# Patient Record
Sex: Male | Born: 1965 | Race: Black or African American | Hispanic: No | Marital: Married | Smoking: Never smoker
Health system: Southern US, Community
[De-identification: ages and names within clinical notes are randomized; demographics above are authoritative.]

---

## 2000-04-12 ENCOUNTER — Emergency Department (HOSPITAL_COMMUNITY): Admission: EM | Admit: 2000-04-12 | Discharge: 2000-04-12 | Payer: Self-pay | Admitting: Emergency Medicine

## 2000-11-25 ENCOUNTER — Emergency Department (HOSPITAL_COMMUNITY): Admission: EM | Admit: 2000-11-25 | Discharge: 2000-11-26 | Payer: Self-pay | Admitting: Emergency Medicine

## 2004-07-24 ENCOUNTER — Encounter: Admission: RE | Admit: 2004-07-24 | Discharge: 2004-07-24 | Payer: Self-pay | Admitting: Family Medicine

## 2008-08-16 ENCOUNTER — Ambulatory Visit (HOSPITAL_COMMUNITY): Admission: RE | Admit: 2008-08-16 | Discharge: 2008-08-16 | Payer: Self-pay | Admitting: Family Medicine

## 2011-11-04 ENCOUNTER — Ambulatory Visit: Payer: BC Managed Care – PPO

## 2011-11-04 ENCOUNTER — Ambulatory Visit: Payer: BC Managed Care – PPO | Admitting: Family Medicine

## 2011-11-04 VITALS — BP 108/73 | HR 82 | Temp 98.5°F | Resp 16 | Ht 71.5 in | Wt 225.4 lb

## 2011-11-04 DIAGNOSIS — M79644 Pain in right finger(s): Secondary | ICD-10-CM

## 2011-11-04 DIAGNOSIS — L03019 Cellulitis of unspecified finger: Secondary | ICD-10-CM

## 2011-11-04 DIAGNOSIS — M79609 Pain in unspecified limb: Secondary | ICD-10-CM

## 2011-11-04 DIAGNOSIS — IMO0001 Reserved for inherently not codable concepts without codable children: Secondary | ICD-10-CM

## 2011-11-04 MED ORDER — DOXYCYCLINE HYCLATE 100 MG PO TABS: 100.0000 mg | ORAL_TABLET | Freq: Two times a day (BID) | ORAL | Status: AC

## 2011-11-04 NOTE — Patient Instructions (Signed)
Paronychia  Paronychia is an inflammatory reaction involving the folds of the skin surrounding the fingernail. This is commonly caused by an infection in the skin around a nail. The most common cause of paronychia is frequent wetting of the hands (as seen with bartenders, food servers, nurses or others who wet their hands). This makes the skin around the fingernail susceptible to infection by bacteria (germs) or fungus. Other predisposing factors are:   Aggressive manicuring.   Nail biting.   Thumb sucking.  The most common cause is a staphylococcal (a type of germ) infection, or a fungal (Candida) infection. When caused by a germ, it usually comes on suddenly with redness, swelling, pus and is often painful. It may get under the nail and form an abscess (collection of pus), or form an abscess around the nail. If the nail itself is infected with a fungus, the treatment is usually prolonged and may require oral medicine for up to one year. Your caregiver will determine the length of time treatment is required. The paronychia caused by bacteria (germs) may largely be avoided by not pulling on hangnails or picking at cuticles. When the infection occurs at the tips of the finger it is called felon. When the cause of paronychia is from the herpes simplex virus (HSV) it is called herpetic whitlow.  TREATMENT   When an abscess is present treatment is often incision and drainage. This means that the abscess must be cut open so the pus can get out. When this is done, the following home care instructions should be followed.  HOME CARE INSTRUCTIONS    It is important to keep the affected fingers very dry. Rubber or plastic gloves over cotton gloves should be used whenever the hand must be placed in water.   Keep wound clean, dry and dressed as suggested by your caregiver between warm soaks or warm compresses.   Soak in warm water for fifteen to twenty minutes three to four times per day for bacterial infections. Fungal  infections are very difficult to treat, so often require treatment for long periods of time.   For bacterial (germ) infections take antibiotics (medicine which kill germs) as directed and finish the prescription, even if the problem appears to be solved before the medicine is gone.   Only take over-the-counter or prescription medicines for pain, discomfort, or fever as directed by your caregiver.  SEEK IMMEDIATE MEDICAL CARE IF:   You have redness, swelling, or increasing pain in the wound.   You notice pus coming from the wound.   You have a fever.   You notice a bad smell coming from the wound or dressing.  Document Released: 10/23/2000 Document Revised: 04/18/2011 Document Reviewed: 06/24/2008  Exit[employer] Patient Information 2012 [employer].

## 2011-11-04 NOTE — Progress Notes (Signed)
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 This began approximately one week ago. At first he thought there might be some foreign body there and he scraped the skin along the cuticle and and the problem seemed to improve. Then they glucose will be done more and is here today to find out if there is a splinter or some other kind of infection.  Objective: Radial side of middle finger cuticle is swollen with some scaling, and is tender to palpation. There is full range of motion in the finger and there is no swelling on the other side or near the PIP joint.  [facility] reading (PRIMARY) by  Dr. Milus Glazier right middle finger: mild cuticular swelling without f.b.  Assessment:  Paronychia  Plan:  Doxycycline 100 bid x 7

## 2011-11-04 NOTE — Progress Notes (Signed)
   Patient ID: JOSHUWA VECCHIO MRN: [id number], DOB: July 02, 1965, 46 y.o. Date of Encounter: 11/04/2011, 2:48 PM    PROCEDURE NOTE: Verbal consent obtained. Betadine prep per usual protocol. Metacarpal block with 2% lidocaine plain. 0.5 cm incision made with 11 blade along the medial nail fold.  Minimal purulence expressed. Dressed. Wound care instructions including precautions with patient. Patient tolerated the procedure well. Recheck in 48 hours if no improvement of symptoms, sooner if worse.      Signed, Eula Listen, PA-C 11/04/2011 2:48 PM

## 2017-10-10 ENCOUNTER — Emergency Department (HOSPITAL_COMMUNITY): Payer: Self-pay

## 2017-10-10 ENCOUNTER — Encounter (HOSPITAL_COMMUNITY): Payer: Self-pay | Admitting: Emergency Medicine

## 2017-10-10 ENCOUNTER — Emergency Department (HOSPITAL_COMMUNITY)
Admission: EM | Admit: 2017-10-10 | Discharge: 2017-10-11 | Disposition: A | Discharge status: Home / Self Care | Payer: Self-pay | Attending: Emergency Medicine | Admitting: Emergency Medicine

## 2017-10-10 DIAGNOSIS — R51 Headache: Secondary | ICD-10-CM | POA: Insufficient documentation

## 2017-10-10 DIAGNOSIS — R519 Headache, unspecified: Secondary | ICD-10-CM

## 2017-10-10 LAB — C-REACTIVE PROTEIN: CRP: 2.1 mg/dL — ABNORMAL HIGH (ref <1.0)

## 2017-10-10 LAB — BASIC METABOLIC PANEL
Anion gap: 9 (ref 5–15)
BUN: 11 mg/dL (ref 6–20)
CO2: 26 mmol/L (ref 22–32)
Calcium: 9.3 mg/dL (ref 8.9–10.3)
Chloride: 105 mmol/L (ref 101–111)
Creatinine, Ser: 1.04 mg/dL (ref 0.61–1.24)
GFR calc Af Amer: >60 mL/min (ref >60)
GFR calc non Af Amer: >60 mL/min (ref >60)
Glucose, Bld: 97 mg/dL (ref 65–99)
Potassium: 4 mmol/L (ref 3.5–5.1)
Sodium: 140 mmol/L (ref 135–145)

## 2017-10-10 LAB — CBC WITH DIFFERENTIAL/PLATELET
Abs Immature Granulocytes: 0 10*3/uL (ref 0.0–0.1)
Basophils Absolute: 0 10*3/uL (ref 0.0–0.1)
Basophils Relative: 0 %
Eosinophils Absolute: 0.1 10*3/uL (ref 0.0–0.7)
Eosinophils Relative: 1 %
HCT: 46.1 % (ref 39.0–52.0)
Hemoglobin: 15.4 g/dL (ref 13.0–17.0)
Immature Granulocytes: 1 %
Lymphocytes Relative: 17 %
Lymphs Abs: 1.1 10*3/uL (ref 0.7–4.0)
MCH: 30 pg (ref 26.0–34.0)
MCHC: 33.4 g/dL (ref 30.0–36.0)
MCV: 89.7 fL (ref 78.0–100.0)
Monocytes Absolute: 0.5 10*3/uL (ref 0.1–1.0)
Monocytes Relative: 8 %
Neutro Abs: 4.5 10*3/uL (ref 1.7–7.7)
Neutrophils Relative %: 73 %
Platelets: 231 10*3/uL (ref 150–400)
RBC: 5.14 MIL/uL (ref 4.22–5.81)
RDW: 11.9 % (ref 11.5–15.5)
WBC: 6.1 10*3/uL (ref 4.0–10.5)

## 2017-10-10 LAB — SEDIMENTATION RATE: Sed Rate: 15 mm/hr (ref 0–16)

## 2017-10-10 NOTE — ED Provider Notes (Signed)
Patient placed in Quick Look pathway, seen and evaluated   Chief Complaint: Headache  HPI:   Patient presents today with complaint of gradual onset, intermittent headaches since yesterday.  He states that he had a right frontal headache near the temples radiating to behind the right eye yesterday which lasted for several hours but did improve with ibuprofen.  He had recurrence of the same headache today while at work.  They took his blood pressure at the time which was elevated to 170/110.  He states the headaches are severe and unlike headaches he has had in the past.  He went to urgent care for evaluation who recommended presentation to the ED for further work-up.  He has no history of diabetes, hypertension, hyperlipidemia.  He is a non-smoker.  No recent trauma or falls.  He denies fevers, neck pain, neck stiffness, vision changes.    He does note 3-day history of nasal congestion and productive cough.  He notes intermittent shortness of breath but denies chest pain.  ROS: Positive for headaches, cough, nasal congestion negative for fevers, vision changes, neck stiffness, neck pain, numbness, tingling, weakness.  Physical Exam:   Gen: No distress  Neuro: Awake and Alert  Skin: Warm    Focused Exam: TMs without erythema or bulging bilaterally.  Nasal septum is midline with mild mucosal edema.  He has right maxillary and frontal sinus tenderness.  He has tenderness to palpation of the right temple although there is no enlarged artery on examination.  He exhibits fluent speech with no evidence of dysarthria or aphasia, no facial droop.  Sensation is intact to soft touch of extremities.  Cranial nerves II through XII tested and intact.  No pronator drift.  No pain with EOMs or nystagmus noted.  Good grip strength bilaterally.  He is tachycardic, no murmurs rubs or gallops noted.  Lungs clear to auscultation bilaterally.  Equal rise and fall of chest, no increased work of breathing.  Speaking in  full sentences without difficulty.   Initiation of care has begun. The patient has been counseled on the process, plan, and necessity for staying for the completion/evaluation, and the remainder of the medical screening examination    Bennye Alm 10/10/17 2025    Cathren Laine, MD 10/10/17 2253

## 2017-10-10 NOTE — ED Triage Notes (Signed)
Reports having headache yesterday better with some otc meds then came back today.  Reports checking bp at work and it was elevated at 170/110.  Seen at [facility].  Sent here from [facility].

## 2017-10-11 MED ORDER — IBUPROFEN 600 MG PO TABS: 600.0000 mg | ORAL_TABLET | Freq: Four times a day (QID) | ORAL | 0 refills | Status: AC | PRN

## 2017-10-11 MED ORDER — FLUTICASONE PROPIONATE 50 MCG/ACT NA SUSP: 1.0000 | Freq: Every day | NASAL | 0 refills | Status: AC

## 2017-10-11 MED ORDER — METOCLOPRAMIDE HCL 5 MG/ML IJ SOLN
10.0000 mg | Freq: Once | INTRAMUSCULAR | Status: AC
  Administered 2017-10-11: 10 mg via INTRAVENOUS
  Filled 2017-10-11: qty 2

## 2017-10-11 MED ORDER — DIPHENHYDRAMINE HCL 50 MG/ML IJ SOLN
12.5000 mg | Freq: Once | INTRAMUSCULAR | Status: AC
  Administered 2017-10-11: 12.5 mg via INTRAVENOUS
  Filled 2017-10-11: qty 1

## 2017-10-11 MED ORDER — DEXAMETHASONE SODIUM PHOSPHATE 10 MG/ML IJ SOLN
10.0000 mg | Freq: Once | INTRAMUSCULAR | Status: AC
  Administered 2017-10-11: 10 mg via INTRAVENOUS
  Filled 2017-10-11: qty 1

## 2017-10-11 MED ORDER — SODIUM CHLORIDE 0.9 % IV BOLUS
1000.0000 mL | Freq: Once | INTRAVENOUS | Status: AC
  Administered 2017-10-11: 1000 mL via INTRAVENOUS

## 2017-10-11 NOTE — ED Provider Notes (Signed)
[facility] EMERGENCY DEPARTMENT Provider Note   CSN: [id number] Arrival date & time: 10/10/17  4098     History   Chief Complaint Chief Complaint  Patient presents with  . Headache    HPI Terry Gibbs is a 52 y.o. male.  Patient presents with sharp, severe right temporal headache since yesterday. He describes the headache as constant, non-throbbing. He reports same headache 2 days ago that resolved with ibuprofen. Since it returned yesterday he has taken Dayquil, ibuprofen and alka seltzer without relief. The pain has prevented him from sleeping. He does not want to eat or drink but denies nausea. No history of similar headache. He reports recent cold symptoms and sinus pressure without fever. No modifying factors to the headache.  The history is provided by the patient and the spouse. No language interpreter was used.    History reviewed. No pertinent past medical history.  There are no active problems to display for this patient.   History reviewed. No pertinent surgical history.      Home Medications    Prior to Admission medications   Not on File    Family History No family history on file.  Social History Social History   Tobacco Use  . Smoking status: Never Smoker  . Smokeless tobacco: Never Used  Substance Use Topics  . Alcohol use: Never    Frequency: Never  . Drug use: Never     Allergies   Patient has no known allergies.   Review of Systems Review of Systems  Constitutional: Positive for appetite change. Negative for chills and fever.  HENT: Positive for congestion, sinus pressure and sinus pain.   Eyes: Negative for photophobia.  Respiratory: Negative.  Negative for cough.   Cardiovascular: Negative.   Gastrointestinal: Negative.  Negative for abdominal pain and vomiting.  Musculoskeletal: Negative.  Negative for neck stiffness.  Skin: Negative.   Neurological: Positive for headaches.     Physical Exam Updated  Vital Signs BP (!) 156/103 (BP Location: Right Arm)   Pulse (!) 102   Temp 99.5 F (37.5 C) (Oral)   Resp 18   Ht 5\' 11"  (1.803 m)   Wt 102.1 kg (225 lb)   SpO2 99%   BMI 31.38 kg/m   Physical Exam  Constitutional: He is oriented to person, place, and time. He appears well-developed and well-nourished.  HENT:  Head: Normocephalic.  Neck: Normal range of motion. Neck supple.  Cardiovascular: Normal rate and regular rhythm.  No temporal bruit.  Pulmonary/Chest: Effort normal and breath sounds normal.  Abdominal: Soft. Bowel sounds are normal. There is no tenderness. There is no rebound and no guarding.  Musculoskeletal: Normal range of motion.  Neurological: He is alert and oriented to person, place, and time. He has normal strength. He displays normal reflexes. No sensory deficit. He displays a negative Romberg sign. Coordination normal. GCS eye subscore is 4. GCS verbal subscore is 5. GCS motor subscore is 6.  Skin: Skin is warm and dry. No rash noted.  Psychiatric: He has a normal mood and affect.     ED Treatments / Results  Labs (all labs ordered are listed, but only abnormal results are displayed) Labs Reviewed  C-REACTIVE PROTEIN - Abnormal; Notable for the following components:      Result Value   CRP 2.1 (*)    All other components within normal limits  SEDIMENTATION RATE  BASIC METABOLIC PANEL  CBC WITH DIFFERENTIAL/PLATELET   Results for orders placed or  performed during the hospital encounter of 10/10/17  Sedimentation rate  Result Value Ref Range   Sed Rate 15 0 - 16 mm/hr  C-reactive protein  Result Value Ref Range   CRP 2.1 (H) <1.0 mg/dL  Basic metabolic panel  Result Value Ref Range   Sodium 140 135 - 145 mmol/L   Potassium 4.0 3.5 - 5.1 mmol/L   Chloride 105 101 - 111 mmol/L   CO2 26 22 - 32 mmol/L   Glucose, Bld 97 65 - 99 mg/dL   BUN 11 6 - 20 mg/dL   Creatinine, Ser 0.981.04 0.61 - 1.24 mg/dL   Calcium 9.3 8.9 - 11.910.3 mg/dL   GFR calc non Af Amer  >60 >60 mL/min   GFR calc Af Amer >60 >60 mL/min   Anion gap 9 5 - 15  CBC with Differential  Result Value Ref Range   WBC 6.1 4.0 - 10.5 K/uL   RBC 5.14 4.22 - 5.81 MIL/uL   Hemoglobin 15.4 13.0 - 17.0 g/dL   HCT 14.746.1 82.939.0 - 56.252.0 %   MCV 89.7 78.0 - 100.0 fL   MCH 30.0 26.0 - 34.0 pg   MCHC 33.4 30.0 - 36.0 g/dL   RDW 13.011.9 86.511.5 - 78.415.5 %   Platelets 231 150 - 400 K/uL   Neutrophils Relative % 73 %   Neutro Abs 4.5 1.7 - 7.7 K/uL   Lymphocytes Relative 17 %   Lymphs Abs 1.1 0.7 - 4.0 K/uL   Monocytes Relative 8 %   Monocytes Absolute 0.5 0.1 - 1.0 K/uL   Eosinophils Relative 1 %   Eosinophils Absolute 0.1 0.0 - 0.7 K/uL   Basophils Relative 0 %   Basophils Absolute 0.0 0.0 - 0.1 K/uL   Immature Granulocytes 1 %   Abs Immature Granulocytes 0.0 0.0 - 0.1 K/uL    EKG None  Radiology Dg Chest 2 View  Result Date: 10/10/2017 CLINICAL DATA:  Headache, hypertension EXAM: CHEST - 2 VIEW COMPARISON:  None. FINDINGS: Lungs are clear.  No pleural effusion or pneumothorax. The heart is normal in size. Visualized osseous structures are within normal limits. IMPRESSION: Normal chest radiographs. Electronically Signed   By: Charline BillsSriyesh  Krishnan M.D.   On: 10/10/2017 20:50   Ct Head Wo Contrast  Result Date: 10/10/2017 CLINICAL DATA:  Headaches and hypertension EXAM: CT HEAD WITHOUT CONTRAST TECHNIQUE: Contiguous axial images were obtained from the base of the skull through the vertex without intravenous contrast. COMPARISON:  None. FINDINGS: Brain: No evidence of acute infarction, hemorrhage, hydrocephalus, extra-axial collection or mass lesion/mass effect. Vascular: No hyperdense vessel or unexpected calcification. Skull: Normal. Negative for fracture or focal lesion. Sinuses/Orbits: Opacification of the right maxillary antrum is noted. Some similar changes are noted within the right frontal and right ethmoid sinuses as well. Other: Advanced dental disease with periapical lucencies in the  maxilla. IMPRESSION: No acute intracranial abnormality noted. Likely chronic changes in the sinuses. Electronically Signed   By: Alcide CleverMark  Lukens M.D.   On: 10/10/2017 20:48    Procedures Procedures (including critical care time)  Medications Ordered in ED Medications  metoCLOPramide (REGLAN) injection 10 mg (has no administration in time range)  diphenhydrAMINE (BENADRYL) injection 12.5 mg (has no administration in time range)  dexamethasone (DECADRON) injection 10 mg (has no administration in time range)  sodium chloride 0.9 % bolus 1,000 mL (has no administration in time range)     Initial Impression / Assessment and Plan / ED Course  I have reviewed the  triage vital signs and the nursing notes.  Pertinent labs & imaging results that were available during my care of the patient were reviewed by me and considered in my medical decision making (see chart for details).     Patient presents with severe, sharp right temporal headache x 1 day. No difficulty speaking, no facial asymmetry, no gait or strength deficits, no fever. He has had sinus congestion and pressure over the past one week.   Normal neurologic exam without deficits. DDx: temporal arteritis vs sinus headache.  Sed rate is normal. Do not feel this is temporal arteritis.  Head CT reviewed and shows sinus inflammation on right c/w headache pain. Headache cocktail provided with relief of headache. This included decadron which will help with inflammation in sinuses. VS normalized after pain control.   He can be discharged home. He and his wife are comfortable with plan to discharge. Recommend follow up prn with PCP.  Final Clinical Impressions(s) / ED Diagnoses   Final diagnoses:  None   1. Sinus headache  ED Discharge Orders    None       Elpidio Anis, PA-C 10/11/17 0340    Gilda Crease, MD 10/11/17 [phone]

## 2017-10-11 NOTE — Discharge Instructions (Addendum)
Your symptoms and head CT findings support a diagnosis of sinus headache. Use Flonase as prescribed and take ibuprofen as well. An antihistamine like Benadryl or Claritin is also recommended. Return here with any worsening symptoms or new concerns.

## 2018-11-17 IMAGING — CT CT HEAD W/O CM
4 series · 17 of 47 positions shown, 19 images · non-contrast
Comparison: None.

CLINICAL DATA: Headaches and hypertension

EXAM:
CT HEAD WITHOUT CONTRAST
TECHNIQUE: Contiguous axial images were obtained from the base of the skull
through the vertex without intravenous contrast.

[Series 3: head without · axial · non-contrast · 0.44mm/px · z∈[-160,-20]mm · 7 of 38 slices shown, 9 images]
[im 5/38  brain]
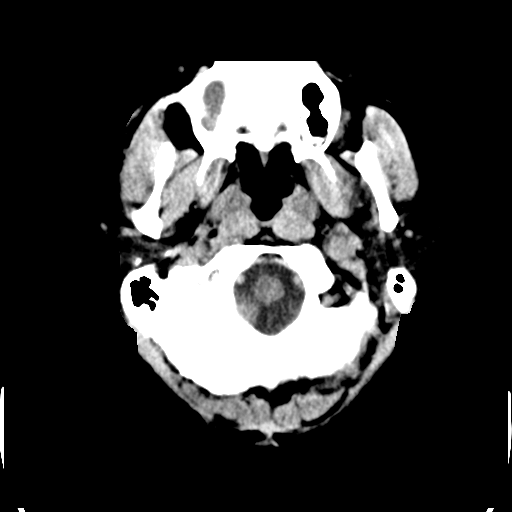
[im 5/38  bone]
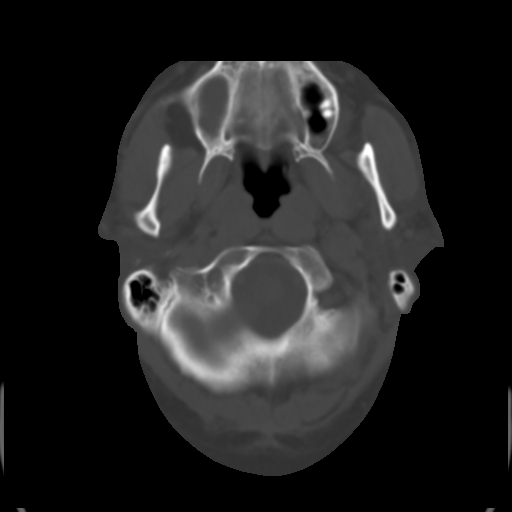
[im 10/38  brain]
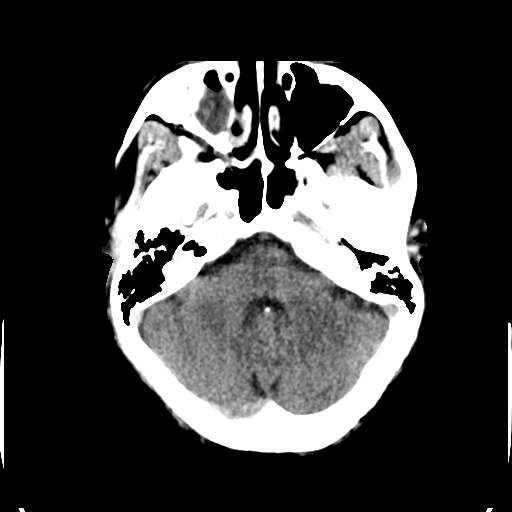
[im 14/38  brain]
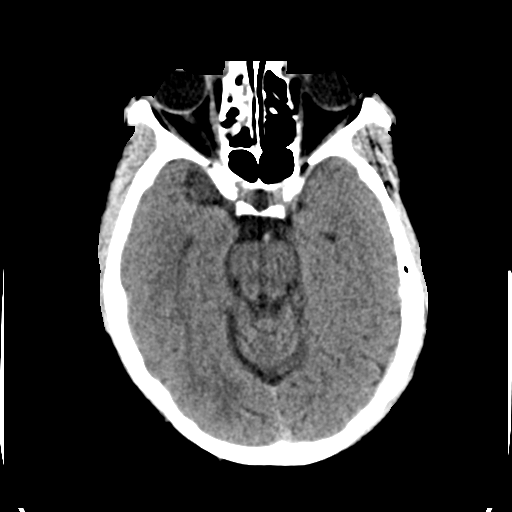
[im 19/38  brain]
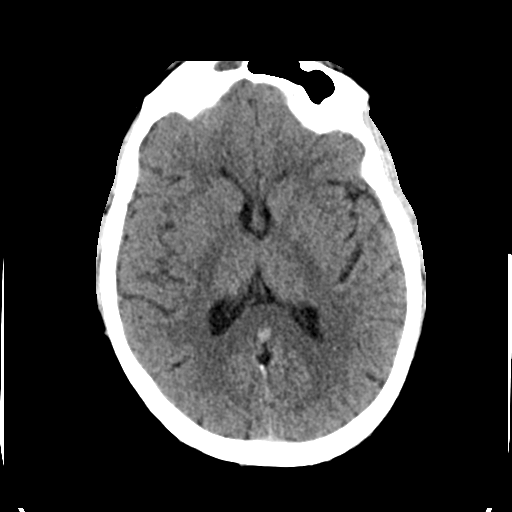
[im 24/38  brain]
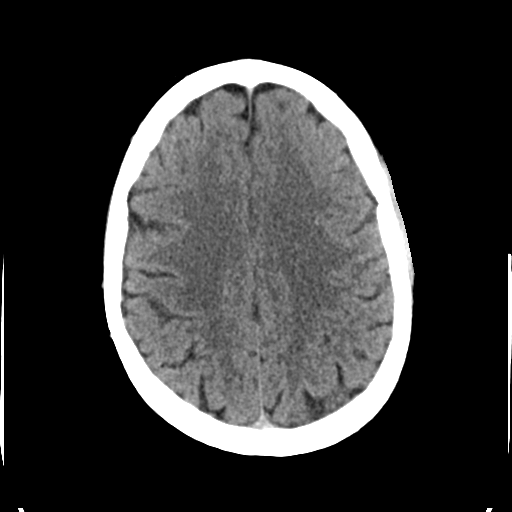
[im 24/38  bone]
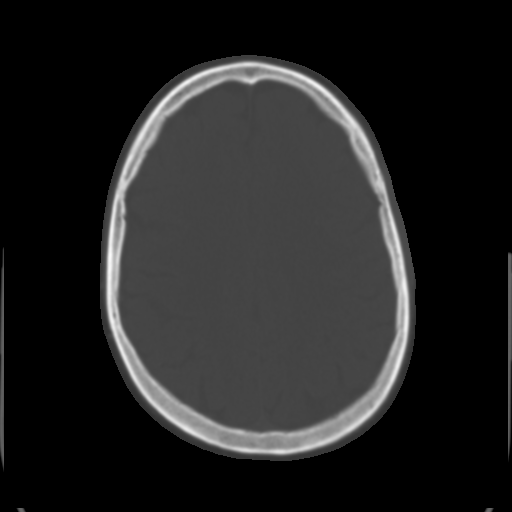
[im 28/38  brain]
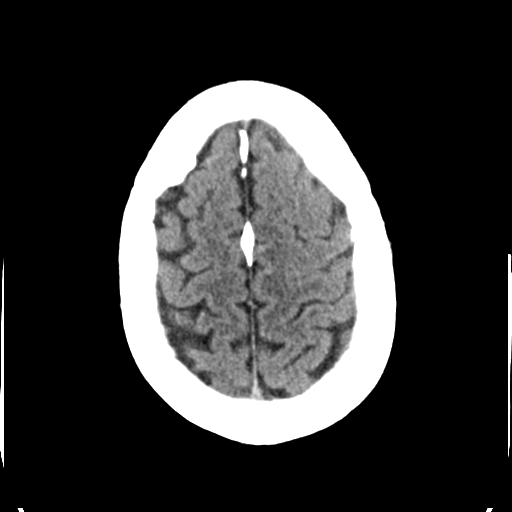
[im 33/38  brain]
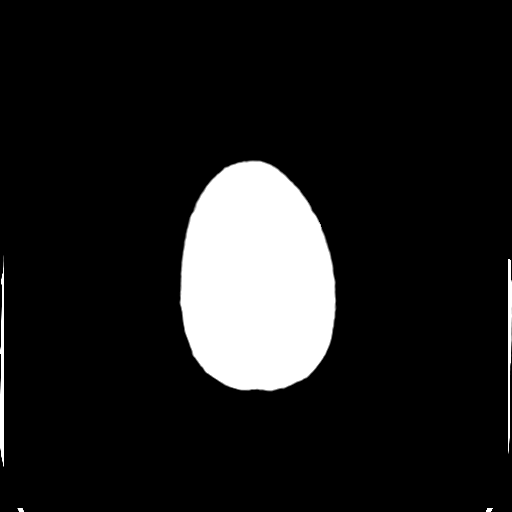

[Series 4: head bone · axial · 0.44mm/px · z∈[-162,-96]mm · 4 of 95 slices shown]
[im 10/95  bone]
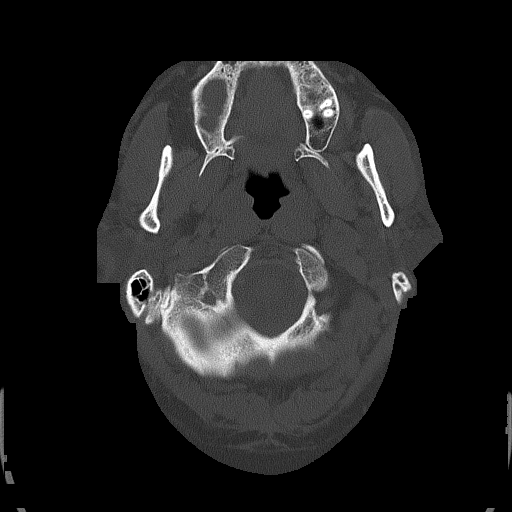
[im 19/95  bone]
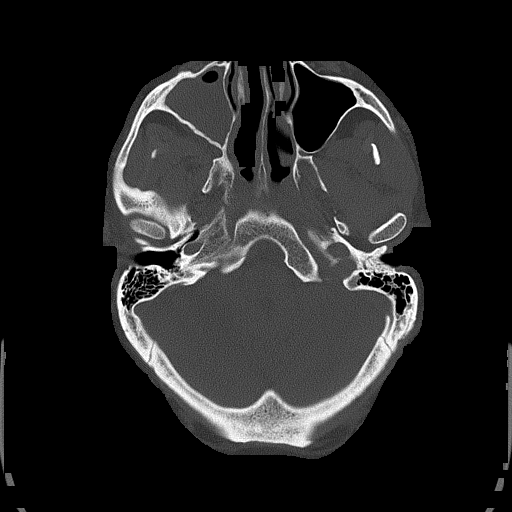
[im 29/95  bone]
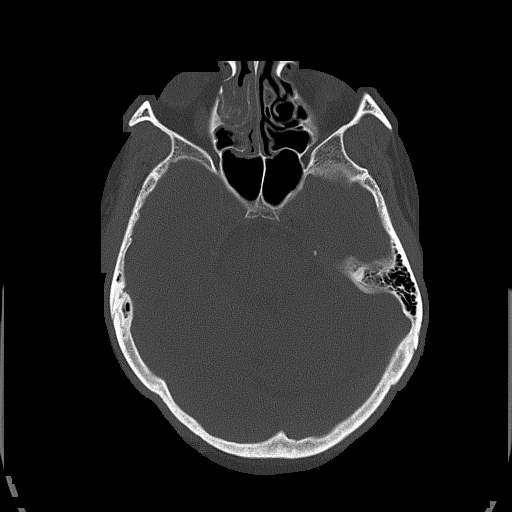
[im 43/95  bone]
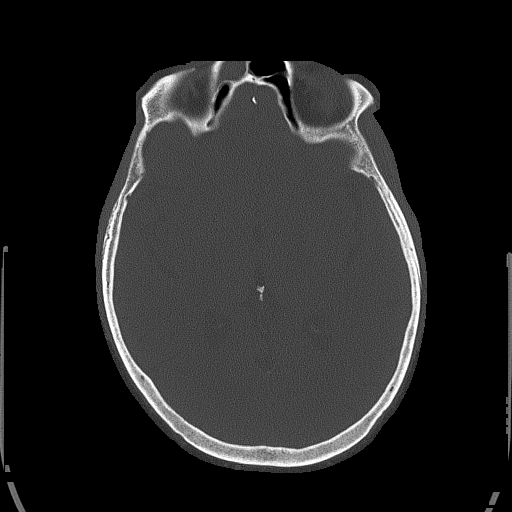

[Series 5: head without cor · coronal · non-contrast · 0.36mm/px · 3 of 71 slices shown]
[im 24/71  brain]
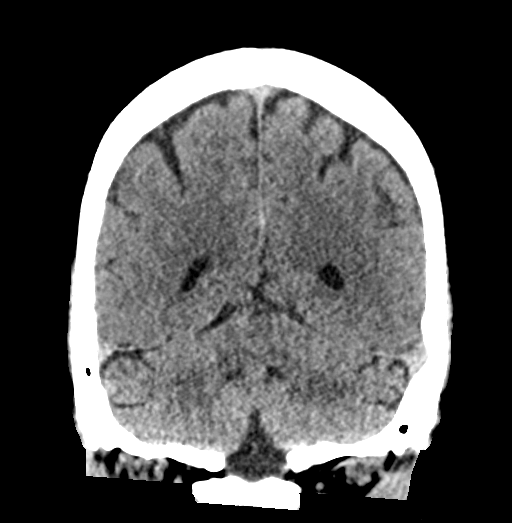
[im 32/71  brain]
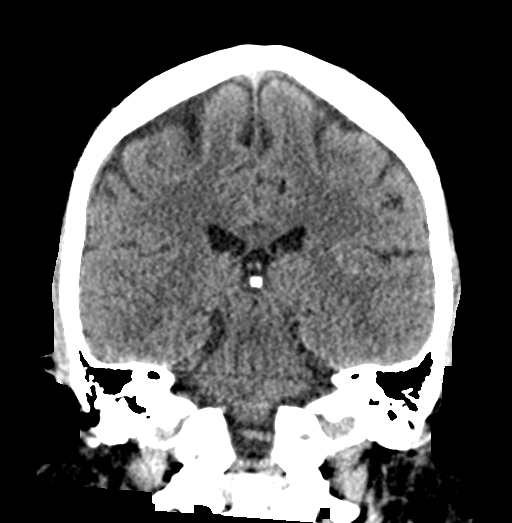
[im 39/71  brain]
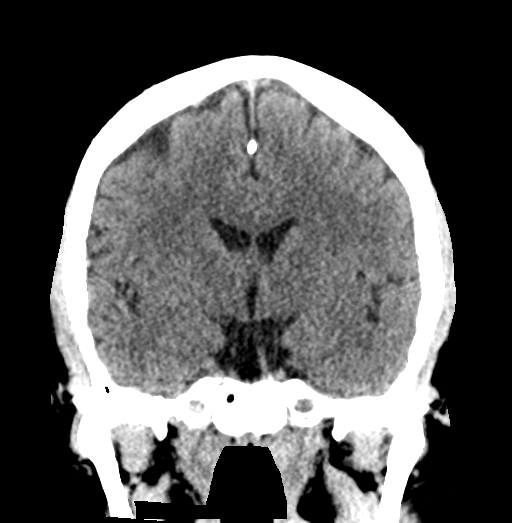

[Series 6: head without sag · sagittal · non-contrast · 0.37mm/px · 3 of 57 slices shown]
[im 19/57  brain]
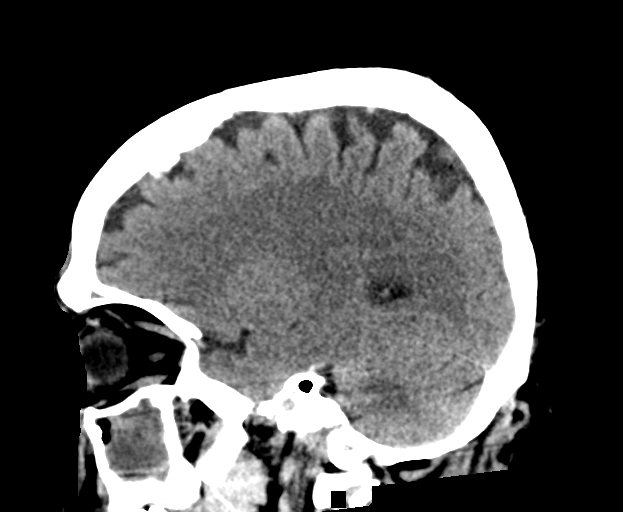
[im 29/57  brain]
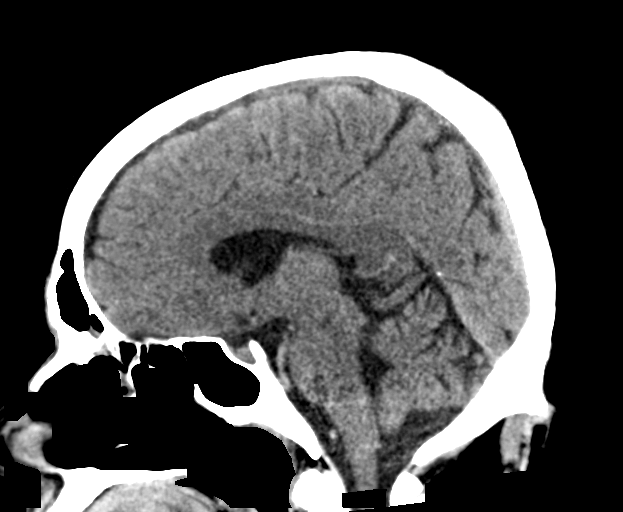
[im 38/57  brain]
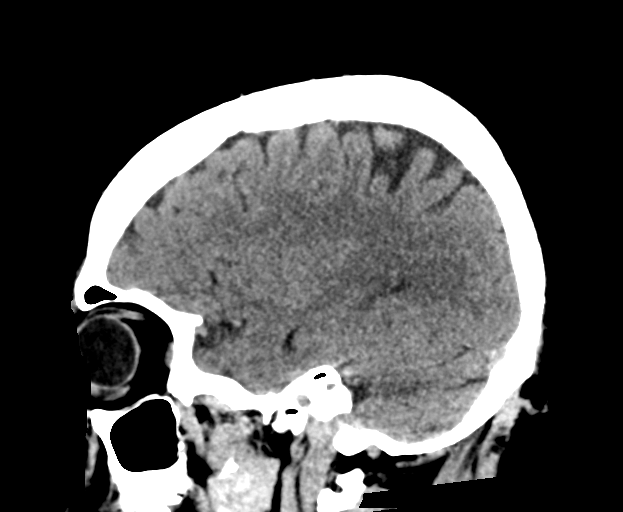

[17 of 47 positions shown; findings below may reference images not displayed]

FINDINGS: Brain: No evidence of acute infarction, hemorrhage, hydrocephalus,
extra-axial collection or mass lesion/mass effect.

Vascular: No hyperdense vessel or unexpected calcification.

Skull: Normal. Negative for fracture or focal lesion.

Sinuses/Orbits: Opacification of the right maxillary antrum is
noted. Some similar changes are noted within the right frontal and
right ethmoid sinuses as well.

Other: Advanced dental disease with periapical lucencies in the
maxilla.
IMPRESSION: No acute intracranial abnormality noted.

Likely chronic changes in the sinuses.

## 2020-07-16 ENCOUNTER — Ambulatory Visit
Admission: EM | Admit: 2020-07-16 | Discharge: 2020-07-16 | Disposition: A | Discharge status: Home / Self Care | Payer: BC Managed Care – PPO | Attending: Emergency Medicine | Admitting: Emergency Medicine

## 2020-07-16 ENCOUNTER — Other Ambulatory Visit: Payer: Self-pay

## 2020-07-16 DIAGNOSIS — M79604 Pain in right leg: Secondary | ICD-10-CM | POA: Diagnosis not present

## 2020-07-16 MED ORDER — NAPROXEN 500 MG PO TABS: 500.0000 mg | ORAL_TABLET | Freq: Two times a day (BID) | ORAL | 0 refills | Status: AC

## 2020-07-16 MED ORDER — CEPHALEXIN 500 MG PO CAPS: 500.0000 mg | ORAL_CAPSULE | Freq: Four times a day (QID) | ORAL | 0 refills | Status: AC

## 2020-07-16 MED ORDER — TRIAMCINOLONE ACETONIDE 0.1 % EX CREA: 1.0000 "application " | TOPICAL_CREAM | Freq: Two times a day (BID) | CUTANEOUS | 0 refills | Status: AC

## 2020-07-16 NOTE — ED Provider Notes (Signed)
[facility]    CSN: [id number] Arrival date & time: 07/16/20  1120      History   Chief Complaint Chief Complaint  Patient presents with  . Leg Pain    Right leg     HPI Terry Gibbs is a 55 y.o. male presenting today for evaluation of right leg pain.  Patient reports over the past 3 to 4 days he has developed discomfort to his anterior right lower leg.  Denies any injury or trauma, but does report that he will occasionally knock his shin on random objects.  He denies any recent wounds to this area.  Pain is localized to circular area slightly on medial aspect.  Denies any calf pain knee pain.  Denies history of DVT/PE.  Denies any recent travel immobilization, surgeries.  Denies tobacco use.  HPI  History reviewed. No pertinent past medical history.  There are no problems to display for this patient.   History reviewed. No pertinent surgical history.     Home Medications    Prior to Admission medications   Medication Sig Start Date End Date Taking? Authorizing Provider  cephALEXin (KEFLEX) 500 MG capsule Take 1 capsule (500 mg total) by mouth 4 (four) times daily for 5 days. 07/16/20 07/21/20 Yes Alayja Armas C, PA-C  naproxen (NAPROSYN) 500 MG tablet Take 1 tablet (500 mg total) by mouth 2 (two) times daily. 07/16/20  Yes Amour Trigg C, PA-C  triamcinolone (KENALOG) 0.1 % Apply 1 application topically 2 (two) times daily. 07/16/20  Yes Angalena Cousineau C, PA-C  fluticasone (FLONASE) 50 MCG/ACT nasal spray Place 1 spray into both nostrils daily. 10/11/17   Elpidio Anis, PA-C  ibuprofen (ADVIL,MOTRIN) 600 MG tablet Take 1 tablet (600 mg total) by mouth every 6 (six) hours as needed. 10/11/17   Elpidio Anis, PA-C    Family History History reviewed. No pertinent family history.  Social History Social History   Tobacco Use  . Smoking status: Never Smoker  . Smokeless tobacco: Never Used  Substance Use Topics  . Alcohol use: Never  . Drug use: Never      Allergies   Patient has no known allergies.   Review of Systems Review of Systems  Constitutional: Negative for fatigue and fever.  Eyes: Negative for redness, itching and visual disturbance.  Respiratory: Negative for shortness of breath.   Cardiovascular: Negative for chest pain and leg swelling.  Gastrointestinal: Negative for nausea and vomiting.  Musculoskeletal: Positive for arthralgias, gait problem and myalgias.  Skin: Positive for color change. Negative for rash and wound.  Neurological: Negative for dizziness, syncope, weakness, light-headedness and headaches.     Physical Exam Triage Vital Signs ED Triage Vitals  Enc Vitals Group     BP 07/16/20 1209 120/77     Pulse Rate 07/16/20 1209 (!) 102     Resp 07/16/20 1209 16     Temp 07/16/20 1209 98.2 F (36.8 C)     Temp Source 07/16/20 1209 Oral     SpO2 07/16/20 1209 95 %     Weight --      Height --      Head Circumference --      Peak Flow --      Pain Score 07/16/20 1208 6     Pain Loc --      Pain Edu? --      Excl. in GC? --    No data found.  Updated Vital Signs BP 120/77 (BP Location: Right  Arm)   Pulse (!) 102   Temp 98.2 F (36.8 C) (Oral)   Resp 16   SpO2 95%   Visual Acuity Right Eye Distance:   Left Eye Distance:   Bilateral Distance:    Right Eye Near:   Left Eye Near:    Bilateral Near:     Physical Exam Vitals and nursing note reviewed.  Constitutional:      Appearance: He is well-developed and well-nourished.     Comments: No acute distress  HENT:     Head: Normocephalic and atraumatic.     Nose: Nose normal.  Eyes:     Conjunctiva/sclera: Conjunctivae normal.  Cardiovascular:     Rate and Rhythm: Normal rate.  Pulmonary:     Effort: Pulmonary effort is normal. No respiratory distress.  Abdominal:     General: There is no distension.  Musculoskeletal:        General: Normal range of motion.     Cervical back: Neck supple.     Comments: Right anterior lower leg  with large eczematous areas noted bilaterally, anterior portion with some scarring/discoloration, and area of pain approximately 3 cm oval area with slight erythema and swelling with tenderness to palpation, does not extend posteriorly  Skin:    General: Skin is warm and dry.  Neurological:     Mental Status: He is alert and oriented to person, place, and time.  Psychiatric:        Mood and Affect: Mood and affect normal.      UC Treatments / Results  Labs (all labs ordered are listed, but only abnormal results are displayed) Labs Reviewed - No data to display  EKG   Radiology No results found.  Procedures Procedures (including critical care time)  Medications Ordered in UC Medications - No data to display  Initial Impression / Assessment and Plan / UC Course  I have reviewed the triage vital signs and the nursing notes.  Pertinent labs & imaging results that were available during my care of the patient were reviewed by me and considered in my medical decision making (see chart for details).     Focal area of slight erythema warmth and swelling with tenderness to anterior right lower leg, concerning for possible early cellulitis initiating on Keflex, recommending anti-inflammatories as well with elevation to help with any contusion to the area.  Wife expresses concern over possible blood clot, area does not extend to calf, low suspicion of DVT, did discuss possibility of superficial clot that I would expect to resolve with anti-inflammatories warm compresses.  Continue to monitor, if symptoms not improving or worsening, spreading return for reevaluation.  Discussed strict return precautions. Patient verbalized understanding and is agreeable with plan.  Final Clinical Impressions(s) / UC Diagnoses   Final diagnoses:  Anterior leg pain, right     Discharge Instructions     Begin Keflex 4 times daily for the next 5 days Naprosyn twice daily for pain and swelling Elevate  leg, alternate ice and heat Triamcinolone cream topically twice daily to eczema areas, also apply thick moisturizing creams Follow-up if not improving or worsening despite use of the above over the next 2 to 3 days   ED Prescriptions    Medication Sig Dispense Auth. Provider   naproxen (NAPROSYN) 500 MG tablet Take 1 tablet (500 mg total) by mouth 2 (two) times daily. 30 tablet Larayah Clute C, PA-C   cephALEXin (KEFLEX) 500 MG capsule Take 1 capsule (500 mg total) by mouth  4 (four) times daily for 5 days. 20 capsule Tashanna Dolin C, PA-C   triamcinolone (KENALOG) 0.1 % Apply 1 application topically 2 (two) times daily. 30 g Arav Bannister, [place] C, PA-C     PDMP not reviewed this encounter.   Lew Dawes, [place] 07/16/20 1413

## 2020-07-16 NOTE — ED Triage Notes (Signed)
Pt present right leg pain. Symptoms started on Thursday. Pt states that sometime he can bare weight on the leg and sometimes not. Pt states when he hits it up against something it hurts. The pain is in the front of the leg.

## 2020-07-16 NOTE — Discharge Instructions (Signed)
Begin Keflex 4 times daily for the next 5 days Naprosyn twice daily for pain and swelling Elevate leg, alternate ice and heat Triamcinolone cream topically twice daily to eczema areas, also apply thick moisturizing creams Follow-up if not improving or worsening despite use of the above over the next 2 to 3 days
